# Patient Record
Sex: Female | Born: 1980 | Race: White | Hispanic: No | Marital: Single | Smoking: Never smoker
Health system: Southern US, Community
[De-identification: ages and names within clinical notes are randomized; demographics above are authoritative.]

---

## 2011-09-10 ENCOUNTER — Emergency Department (HOSPITAL_BASED_OUTPATIENT_CLINIC_OR_DEPARTMENT_OTHER)
Admission: EM | Admit: 2011-09-10 | Discharge: 2011-09-10 | Disposition: A | Discharge status: Home / Self Care | Payer: Self-pay | Attending: Emergency Medicine | Admitting: Emergency Medicine

## 2011-09-10 ENCOUNTER — Encounter: Payer: Self-pay | Admitting: *Deleted

## 2011-09-10 DIAGNOSIS — J029 Acute pharyngitis, unspecified: Secondary | ICD-10-CM | POA: Insufficient documentation

## 2011-09-10 DIAGNOSIS — H109 Unspecified conjunctivitis: Secondary | ICD-10-CM | POA: Insufficient documentation

## 2011-09-10 DIAGNOSIS — H5789 Other specified disorders of eye and adnexa: Secondary | ICD-10-CM | POA: Insufficient documentation

## 2011-09-10 DIAGNOSIS — H9209 Otalgia, unspecified ear: Secondary | ICD-10-CM | POA: Insufficient documentation

## 2011-09-10 MED ORDER — GATIFLOXACIN 0.5 % OP SOLN
2.0000 [drp] | OPHTHALMIC | Status: DC
  Filled 2011-09-10: qty 2.5

## 2011-09-10 MED ORDER — CIPROFLOXACIN HCL 0.3 % OP SOLN
OPHTHALMIC | Status: AC
  Filled 2011-09-10: qty 2.5

## 2011-09-10 MED ORDER — CIPROFLOXACIN HCL 0.3 % OP SOLN
2.0000 [drp] | OPHTHALMIC | Status: DC
  Administered 2011-09-10: 2 [drp] via OPHTHALMIC

## 2011-09-10 NOTE — ED Provider Notes (Signed)
History     CSN: [id number]  Arrival date & time 09/10/11  0910   First MD Initiated Contact with Patient 09/10/11 [phone]      Chief Complaint  Patient presents with  . Eye Drainage  . Otalgia  . 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 She states that for the past 2 weeks she has been experiencing nasal congestion and rhinorrhea, pain in her right ear, and her throat. She states that since yesterday her eyes had been watery and itching, painful bilaterally. She complains of discharge in both eyes as well. She woke up this morning and her left eye was matted closed from dry discharge. She states she's been trying not to rub her eyes. She denies headache, dizziness, fever, chills. There no sick contacts. She is not currently have her contacts in place and states that for that reason her vision is blurry. She has no visual contacts are in place.  History reviewed. No pertinent past medical history.  History reviewed. No pertinent past surgical history.  History reviewed. No pertinent family history.  History  Substance Use Topics  . Smoking status: Never Smoker   . Smokeless tobacco: Never Used  . Alcohol Use: Yes     occ    OB History    Grav Para Term Preterm Abortions TAB SAB Ect Mult Living                  Review of Systems  All other systems reviewed and are negative.  except as noted HPI   Allergies  Penicillins  Home Medications  No current outpatient prescriptions on file.  BP 130/90  Pulse 96  Temp(Src) 98.4 F (36.9 C) (Oral)  Resp 20  SpO2 100%  LMP 08/11/2011  Physical Exam  Nursing note and vitals reviewed. Constitutional: She is oriented to person, place, and time. She appears well-developed.  HENT:  Head: Atraumatic.  Nose: Nose normal.  Mouth/Throat: Oropharynx is clear and moist.       TM clear b/l  Eyes: EOM are normal.  Pupils are equal, round, and reactive to light. Right eye exhibits discharge. Left eye exhibits discharge.       Conj erythema/injection b/l Mild periorbital swelling No pain with EOM No photophobia No FB under eyelids    Neck: Normal range of motion. Neck supple.  Cardiovascular: Normal rate, regular rhythm, normal heart sounds and intact distal pulses.   Pulmonary/Chest: Effort normal and breath sounds normal. No respiratory distress. She has no wheezes. She has no rales.  Abdominal: Soft. She exhibits no distension. There is no tenderness. There is no rebound and no guarding.  Musculoskeletal: Normal range of motion.  Neurological: She is alert and oriented to person, place, and time.  Skin: Skin is warm and dry. No rash noted.  Psychiatric: She has a normal mood and affect.    ED Course  Procedures (including critical care time)  Labs Reviewed - No data to display No results found.   1. Conjunctivitis     MDM  Bacterial conjunctivitis. Cipro gtt given in ED as patient does not have insurance. F/U PMD-- given referrals. Precautions for return to the ED.        Forbes Cellar, MD 09/10/11 1012

## 2011-09-10 NOTE — ED Notes (Signed)
Eye redness drainage constant watering today also eyes were matted closed yesterday morning last night pt began having severe right ear pain and and sore throat mostly on the right side denies fever nausea or vomiting

## 2019-07-22 ENCOUNTER — Other Ambulatory Visit: Payer: Self-pay

## 2019-07-22 ENCOUNTER — Emergency Department (HOSPITAL_BASED_OUTPATIENT_CLINIC_OR_DEPARTMENT_OTHER): Payer: Self-pay

## 2019-07-22 ENCOUNTER — Encounter (HOSPITAL_BASED_OUTPATIENT_CLINIC_OR_DEPARTMENT_OTHER): Payer: Self-pay | Admitting: *Deleted

## 2019-07-22 ENCOUNTER — Emergency Department (HOSPITAL_BASED_OUTPATIENT_CLINIC_OR_DEPARTMENT_OTHER)
Admission: EM | Admit: 2019-07-22 | Discharge: 2019-07-22 | Disposition: A | Discharge status: Home / Self Care | Payer: Self-pay | Attending: Emergency Medicine | Admitting: Emergency Medicine

## 2019-07-22 DIAGNOSIS — R1110 Vomiting, unspecified: Secondary | ICD-10-CM | POA: Insufficient documentation

## 2019-07-22 DIAGNOSIS — R1111 Vomiting without nausea: Secondary | ICD-10-CM

## 2019-07-22 DIAGNOSIS — N939 Abnormal uterine and vaginal bleeding, unspecified: Secondary | ICD-10-CM | POA: Insufficient documentation

## 2019-07-22 LAB — WET PREP, GENITAL
Clue Cells Wet Prep HPF POC: NONE SEEN
Sperm: NONE SEEN
Trich, Wet Prep: NONE SEEN
Yeast Wet Prep HPF POC: NONE SEEN

## 2019-07-22 LAB — CBC WITH DIFFERENTIAL/PLATELET
Abs Immature Granulocytes: 0.04 10*3/uL (ref 0.00–0.07)
Basophils Absolute: 0 10*3/uL (ref 0.0–0.1)
Basophils Relative: 0 %
Eosinophils Absolute: 0.1 10*3/uL (ref 0.0–0.5)
Eosinophils Relative: 1 %
HCT: 40.7 % (ref 36.0–46.0)
Hemoglobin: 12.8 g/dL (ref 12.0–15.0)
Immature Granulocytes: 0 %
Lymphocytes Relative: 10 %
Lymphs Abs: 1.1 10*3/uL (ref 0.7–4.0)
MCH: 27.5 pg (ref 26.0–34.0)
MCHC: 31.4 g/dL (ref 30.0–36.0)
MCV: 87.5 fL (ref 80.0–100.0)
Monocytes Absolute: 0.5 10*3/uL (ref 0.1–1.0)
Monocytes Relative: 5 %
Neutro Abs: 9 10*3/uL — ABNORMAL HIGH (ref 1.7–7.7)
Neutrophils Relative %: 84 %
Platelets: 253 10*3/uL (ref 150–400)
RBC: 4.65 MIL/uL (ref 3.87–5.11)
RDW: 13.8 % (ref 11.5–15.5)
WBC: 10.8 10*3/uL — ABNORMAL HIGH (ref 4.0–10.5)
nRBC: 0 % (ref 0.0–0.2)

## 2019-07-22 LAB — URINALYSIS, MICROSCOPIC (REFLEX)

## 2019-07-22 LAB — URINALYSIS, ROUTINE W REFLEX MICROSCOPIC
Bilirubin Urine: NEGATIVE
Glucose, UA: NEGATIVE mg/dL
Ketones, ur: 15 mg/dL — AB
Leukocytes,Ua: NEGATIVE
Nitrite: NEGATIVE
Protein, ur: NEGATIVE mg/dL
Specific Gravity, Urine: >1.03 — ABNORMAL HIGH (ref 1.005–1.030)
pH: 5.5 (ref 5.0–8.0)

## 2019-07-22 LAB — LIPASE, BLOOD: Lipase: 27 U/L (ref 11–51)

## 2019-07-22 LAB — COMPREHENSIVE METABOLIC PANEL
ALT: 15 U/L (ref 0–44)
AST: 16 U/L (ref 15–41)
Albumin: 4.4 g/dL (ref 3.5–5.0)
Alkaline Phosphatase: 46 U/L (ref 38–126)
Anion gap: 8 (ref 5–15)
BUN: 13 mg/dL (ref 6–20)
CO2: 23 mmol/L (ref 22–32)
Calcium: 9.3 mg/dL (ref 8.9–10.3)
Chloride: 108 mmol/L (ref 98–111)
Creatinine, Ser: 0.73 mg/dL (ref 0.44–1.00)
GFR calc Af Amer: >60 mL/min (ref >60)
GFR calc non Af Amer: >60 mL/min (ref >60)
Glucose, Bld: 129 mg/dL — ABNORMAL HIGH (ref 70–99)
Potassium: 3.8 mmol/L (ref 3.5–5.1)
Sodium: 139 mmol/L (ref 135–145)
Total Bilirubin: 0.8 mg/dL (ref 0.3–1.2)
Total Protein: 7.5 g/dL (ref 6.5–8.1)

## 2019-07-22 LAB — PREGNANCY, URINE: Preg Test, Ur: NEGATIVE

## 2019-07-22 MED ORDER — SUCRALFATE 1 G PO TABS: 1.0000 g | ORAL_TABLET | Freq: Three times a day (TID) | ORAL | 0 refills | Status: AC

## 2019-07-22 MED ORDER — ONDANSETRON HCL 4 MG/2ML IJ SOLN
4.0000 mg | Freq: Once | INTRAMUSCULAR | Status: AC
  Administered 2019-07-22: 22:00:00 4 mg via INTRAVENOUS
  Filled 2019-07-22: qty 2

## 2019-07-22 MED ORDER — PANTOPRAZOLE SODIUM 20 MG PO TBEC: 20.0000 mg | DELAYED_RELEASE_TABLET | Freq: Every day | ORAL | 0 refills | Status: AC

## 2019-07-22 MED ORDER — SODIUM CHLORIDE 0.9 % IV BOLUS
1000.0000 mL | Freq: Once | INTRAVENOUS | Status: AC
  Administered 2019-07-22: 21:00:00 1000 mL via INTRAVENOUS

## 2019-07-22 MED ORDER — FENTANYL CITRATE (PF) 100 MCG/2ML IJ SOLN
50.0000 ug | Freq: Once | INTRAMUSCULAR | Status: AC
  Administered 2019-07-22: 21:00:00 50 ug via INTRAVENOUS
  Filled 2019-07-22: qty 2

## 2019-07-22 MED ORDER — IOHEXOL 300 MG/ML  SOLN
100.0000 mL | Freq: Once | INTRAMUSCULAR | Status: AC | PRN
  Administered 2019-07-22: 22:00:00 100 mL via INTRAVENOUS

## 2019-07-22 MED ORDER — PANTOPRAZOLE SODIUM 40 MG IV SOLR
40.0000 mg | Freq: Once | INTRAVENOUS | Status: AC
  Administered 2019-07-22: 21:00:00 40 mg via INTRAVENOUS
  Filled 2019-07-22: qty 40

## 2019-07-22 MED ORDER — ONDANSETRON HCL 4 MG/2ML IJ SOLN
4.0000 mg | Freq: Once | INTRAMUSCULAR | Status: AC
  Administered 2019-07-22: 4 mg via INTRAVENOUS
  Filled 2019-07-22: qty 2

## 2019-07-22 MED ORDER — ONDANSETRON HCL 4 MG PO TABS: 4.0000 mg | ORAL_TABLET | Freq: Three times a day (TID) | ORAL | 0 refills | Status: AC | PRN

## 2019-07-22 NOTE — ED Triage Notes (Signed)
Vomiting. Abnormal menses. She has not had a pregnancy test.

## 2019-07-22 NOTE — ED Provider Notes (Signed)
[facility] EMERGENCY DEPARTMENT Provider Note   CSN: [id number] Arrival date & time: 07/22/19  2003     History   Chief Complaint Chief Complaint  Patient presents with  . Abdominal Pain  . Emesis  . Vaginal Bleeding    HPI Shelia Fletcher is a 38 y.o. female.     The history is provided by the patient.  Abdominal Pain Pain location:  Epigastric Pain quality: aching   Pain radiates to:  Does not radiate Pain severity:  Mild Duration:  1 day Timing:  Constant Progression:  Worsening Chronicity:  New Context: not alcohol use and not previous surgeries   Relieved by:  Nothing Worsened by:  Nothing Associated symptoms: nausea, vaginal bleeding (on cycle but was maybe one week late) and vomiting   Associated symptoms: no chest pain, no chills, no cough, no dysuria, no fever, no hematochezia, no hematuria, no melena, no shortness of breath and no sore throat   Risk factors: NSAID use   Risk factors: no alcohol abuse and has not had multiple surgeries     History reviewed. No pertinent past medical history.  There are no active problems to display for this patient.   History reviewed. No pertinent surgical history.   OB History   No obstetric history on file.      Home Medications    Prior to Admission medications   Medication Sig Start Date End Date Taking? Authorizing Provider  ondansetron (ZOFRAN) 4 MG tablet Take 1 tablet (4 mg total) by mouth every 8 (eight) hours as needed for up to 20 doses for nausea or vomiting. 07/22/19   Dalaney Needle, DO  pantoprazole (PROTONIX) 20 MG tablet Take 1 tablet (20 mg total) by mouth daily. 07/22/19 08/21/19  Juwon Scripter, DO  sucralfate (CARAFATE) 1 g tablet Take 1 tablet (1 g total) by mouth 4 (four) times daily -  with meals and at bedtime for 14 days. 07/22/19 08/05/19  Virgina Norfolk, DO    Family History No family history on file.  Social History Social History   Tobacco Use  . Smoking status:  Never Smoker  . Smokeless tobacco: Never Used  Substance Use Topics  . Alcohol use: Not Currently    Comment: occ  . Drug use: No     Allergies   Penicillins   Review of Systems Review of Systems  Constitutional: Negative for chills and fever.  HENT: Negative for ear pain and sore throat.   Eyes: Negative for pain and visual disturbance.  Respiratory: Negative for cough and shortness of breath.   Cardiovascular: Negative for chest pain and palpitations.  Gastrointestinal: Positive for abdominal pain, nausea and vomiting. Negative for hematochezia and melena.  Genitourinary: Positive for vaginal bleeding (on cycle but was maybe one week late). Negative for dysuria and hematuria.  Musculoskeletal: Negative for arthralgias and back pain.  Skin: Negative for color change and rash.  Neurological: Negative for seizures and syncope.  All other systems reviewed and are negative.    Physical Exam Updated Vital Signs  ED Triage Vitals  Enc Vitals Group     BP 07/22/19 2016 (!) 133/95     Pulse Rate 07/22/19 2016 84     Resp 07/22/19 2016 18     Temp 07/22/19 2016 98 F (36.7 C)     Temp Source 07/22/19 2016 Oral     SpO2 07/22/19 2016 100 %     Weight 07/22/19 2012 194 lb 3.2 oz (88.1 kg)  Height 07/22/19 2012  (1.753 m)     Head Circumference --      Peak Flow --      Pain Score 07/22/19 2012 5     Pain Loc --      Pain Edu? --      Excl. in GC? --     Physical Exam Vitals signs and nursing note reviewed.  Constitutional:      General: She is not in acute distress.    Appearance: She is well-developed. She is not ill-appearing.  HENT:     Head: Normocephalic and atraumatic.     Mouth/Throat:     Mouth: Mucous membranes are moist.  Eyes:     Extraocular Movements: Extraocular movements intact.     Conjunctiva/sclera: Conjunctivae normal.     Pupils: Pupils are equal, round, and reactive to light.  Neck:     Musculoskeletal: Neck supple.  Cardiovascular:      Rate and Rhythm: Normal rate and regular rhythm.     Heart sounds: Normal heart sounds. No murmur.  Pulmonary:     Effort: Pulmonary effort is normal. No respiratory distress.     Breath sounds: Normal breath sounds.  Abdominal:     General: There is no distension.     Palpations: Abdomen is soft.     Tenderness: There is abdominal tenderness in the epigastric area. There is no right CVA tenderness, left CVA tenderness, guarding or rebound. Negative signs include Murphy's sign, Rovsing's sign, McBurney's sign and psoas sign.  Genitourinary:    Vagina: Bleeding present.     Cervix: No cervical motion tenderness, discharge or friability.     Uterus: Normal.      Comments: Scant blood from cervix Skin:    General: Skin is warm and dry.     Capillary Refill: Capillary refill takes less than 2 seconds.  Neurological:     General: No focal deficit present.     Mental Status: She is alert.  Psychiatric:        Mood and Affect: Mood normal.      ED Treatments / Results  Labs (all labs ordered are listed, but only abnormal results are displayed) Labs Reviewed  WET PREP, GENITAL - Abnormal; Notable for the following components:      Result Value   WBC, Wet Prep HPF POC FEW (*)    All other components within normal limits  URINALYSIS, ROUTINE W REFLEX MICROSCOPIC - Abnormal; Notable for the following components:   Specific Gravity, Urine >1.030 (*)    Hgb urine dipstick LARGE (*)    Ketones, ur 15 (*)    All other components within normal limits  CBC WITH DIFFERENTIAL/PLATELET - Abnormal; Notable for the following components:   WBC 10.8 (*)    Neutro Abs 9.0 (*)    All other components within normal limits  COMPREHENSIVE METABOLIC PANEL - Abnormal; Notable for the following components:   Glucose, Bld 129 (*)    All other components within normal limits  URINALYSIS, MICROSCOPIC (REFLEX) - Abnormal; Notable for the following components:   Bacteria, UA FEW (*)    All other  components within normal limits  PREGNANCY, URINE  LIPASE, BLOOD  GC/CHLAMYDIA PROBE AMP ([place]) NOT AT [facility]    EKG None  Radiology Ct Abdomen Pelvis W Contrast  Result Date: 07/22/2019 CLINICAL DATA:  Abdominal distension with epigastric pain and vomiting. EXAM: CT ABDOMEN AND PELVIS WITH CONTRAST TECHNIQUE: Multidetector CT imaging of the abdomen and  pelvis was performed using the standard protocol following bolus administration of intravenous contrast. CONTRAST:  110mL OMNIPAQUE IOHEXOL 300 MG/ML  SOLN COMPARISON:  None. FINDINGS: Lower chest: Normal Hepatobiliary: Few tiny scattered liver cysts. No abnormal gallbladder finding. No sign of ductal dilatation. Pancreas: Normal Spleen: Normal Adrenals/Urinary Tract: Adrenal glands are normal. Kidneys are normal. No cyst, mass, stone or hydronephrosis. Bladder is normal. Stomach/Bowel: No sign of ileus or obstruction. Normal amount of fecal matter. Normal appearing appendix. Vascular/Lymphatic: Normal Reproductive: Normal.  No pelvic mass. Other: No free fluid or air. Musculoskeletal: No significant bone finding. IMPRESSION: Normal CT scan. No abnormality seen to explain the presenting symptoms. Electronically Signed   By: Nelson Chimes M.D.   On: 07/22/2019 21:56    Procedures Procedures (including critical care time)  Medications Ordered in ED Medications  ondansetron (ZOFRAN) injection 4 mg (4 mg Intravenous Given 07/22/19 2104)  fentaNYL (SUBLIMAZE) injection 50 mcg (50 mcg Intravenous Given 07/22/19 2104)  pantoprazole (PROTONIX) injection 40 mg (40 mg Intravenous Given 07/22/19 2103)  sodium chloride 0.9 % bolus 1,000 mL (0 mLs Intravenous Stopped 07/22/19 2154)  iohexol (OMNIPAQUE) 300 MG/ML solution 100 mL (100 mLs Intravenous Contrast Given 07/22/19 2141)     Initial Impression / Assessment and Plan / ED Course  I have reviewed the triage vital signs and the nursing notes.  Pertinent labs & imaging results that were  available during my care of the patient were reviewed by me and considered in my medical decision making (see chart for details).     Shelia Fletcher is a 38 year old female with no significant medical history presents to the ED with abdominal pain.  Patient also states she is having some vaginal bleeding that she does not think is related.  Patient with normal vitals.  No fever.  States that she was about a week late for her menstrual cycle and has had fairly heavy bleeding for the last week.  Bleeding has improved.  Pregnancy test was negative.  Pelvic exam was unremarkable.  Overall likely abnormal uterine bleeding.  However, patient does have significant epigastric abdominal tenderness that started yesterday.  She has had some episodes of nausea and vomiting this morning.  Denies any history of abdominal surgeries.  Does not appear to be tender in the right upper quadrant on exam.  She denies any alcohol use.  However she does heavily use ibuprofen while she is on her menstrual cycle. No melena. Suspect likely gastritis versus pancreatitis versus possibly cholecystitis.  Patient has had normal bowel movements.  Less likely bowel obstruction.  Possibly viral illness or food poisoning.  Will give IV fluids, IV Protonix, IV fentanyl, IV Zofran.  Lab work showed no significant anemia, electrolyte abnormality, kidney injury.  No signs of urinary tract infection.  Lipase is normal doubt pancreatitis.  Gallbladder and liver enzymes are unremarkable.  Doubt cholecystitis.  CT scan of the abdomen and pelvis showed no acute findings.  No appendicitis, no bowel obstruction.  No gallstones.  No pancreatitis.  Patient felt improved following treatments.  Suspect likely gastritis versus viral process.  Will treat with Protonix, Zofran, Carafate.  Recommend that she stop taking NSAIDs.  She does not drink alcohol.  Given return precautions and recommend follow-up with primary care doctor.  Discharged in good condition.   This chart was dictated using voice recognition software.  Despite best efforts to proofread,  errors can occur which can change the documentation meaning.    Final Clinical Impressions(s) / ED Diagnoses  Final diagnoses:  Vomiting without nausea, intractability of vomiting not specified, unspecified vomiting type    ED Discharge Orders         Ordered    ondansetron (ZOFRAN) 4 MG tablet  Every 8 hours PRN     07/22/19 2213    pantoprazole (PROTONIX) 20 MG tablet  Daily     07/22/19 2213    sucralfate (CARAFATE) 1 g tablet  3 times daily with meals & bedtime     07/22/19 2213           Virgina NorfolkCuratolo, Raquel Sayres, DO 07/22/19 2220

## 2019-07-26 LAB — GC/CHLAMYDIA PROBE AMP (~~LOC~~) NOT AT ARMC
Chlamydia: NEGATIVE
Neisseria Gonorrhea: NEGATIVE

## 2020-02-11 IMAGING — CT CT ABD-PELV W/ CM
2 of 4 series · 16 of 46 positions shown, 18 images · IV contrast (omnipaque)
Comparison: None.

CLINICAL DATA: Abdominal distension with epigastric pain and
vomiting.

EXAM:
CT ABDOMEN AND PELVIS WITH CONTRAST
TECHNIQUE: Multidetector CT imaging of the abdomen and pelvis was performed
using the standard protocol following bolus administration of
intravenous contrast.
CONTRAST:  100mL OMNIPAQUE IOHEXOL 300 MG/ML  SOLN

[Series 2: axial st · axial · 0.72mm/px · z∈[-577,-132]mm · 13 of 99 slices shown, 15 images]
[im 5/99  soft-tissue]
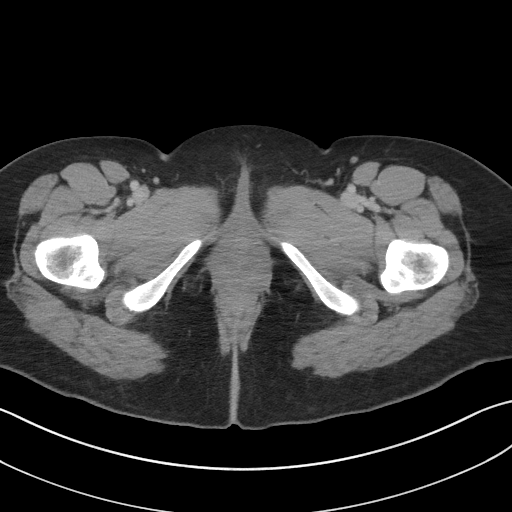
[im 5/99  bone]
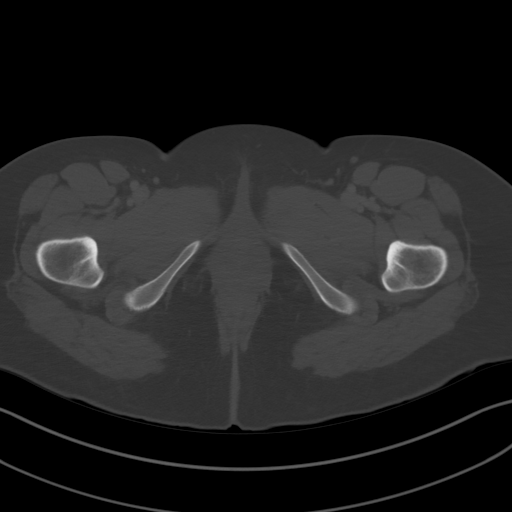
[im 13/99  soft-tissue]
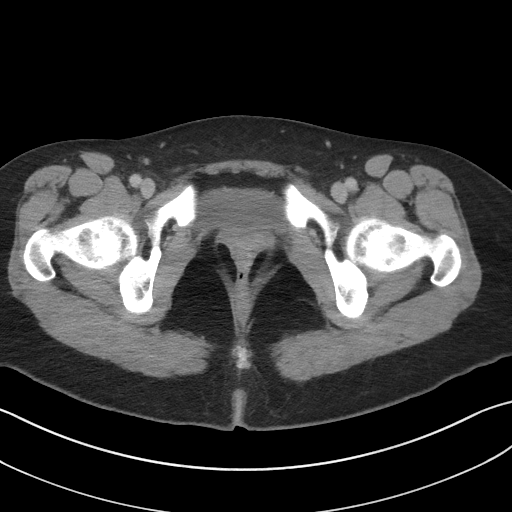
[im 22/99  soft-tissue]
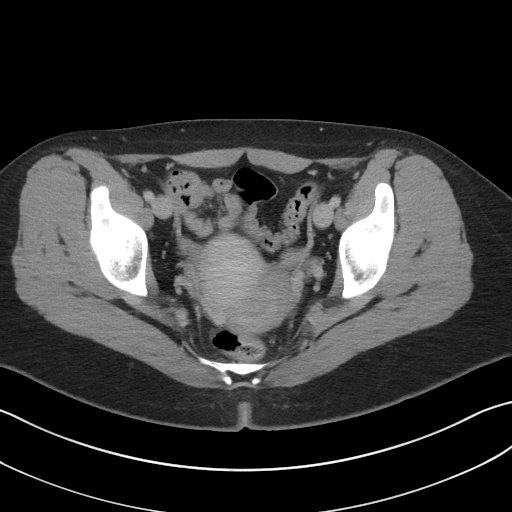
[im 26/99  soft-tissue]
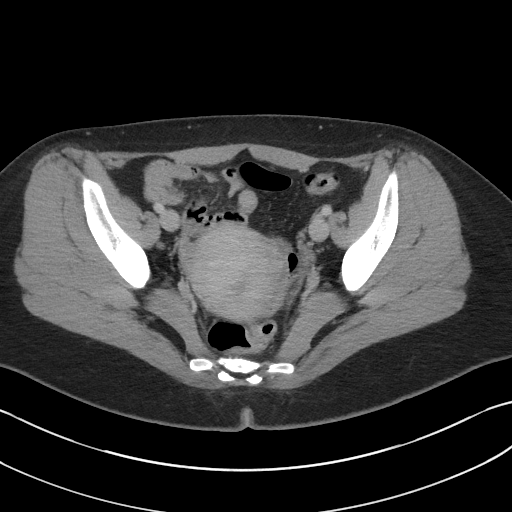
[im 35/99  soft-tissue]
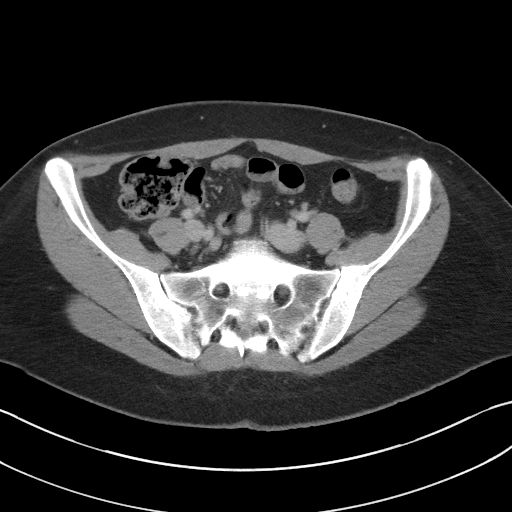
[im 43/99  soft-tissue]
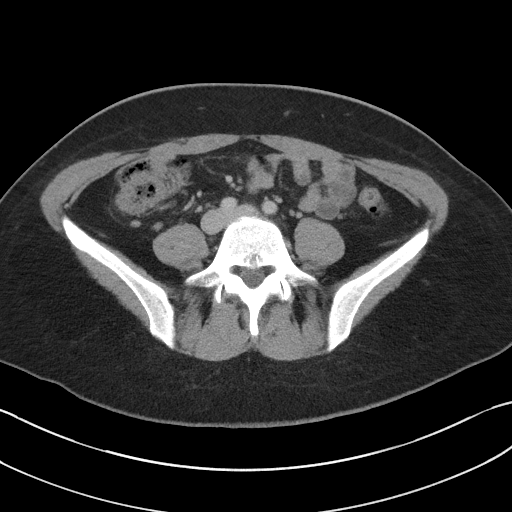
[im 52/99  soft-tissue]
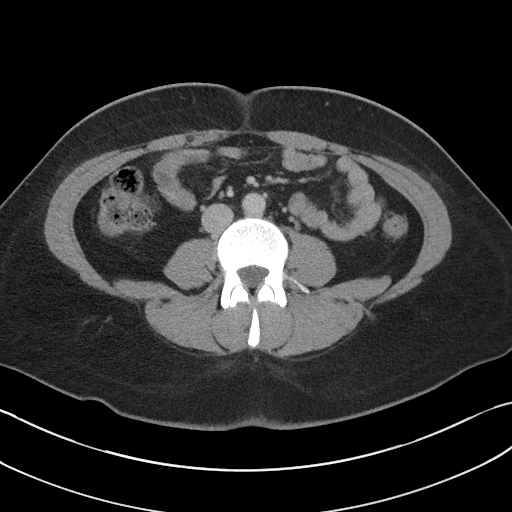
[im 56/99  soft-tissue]
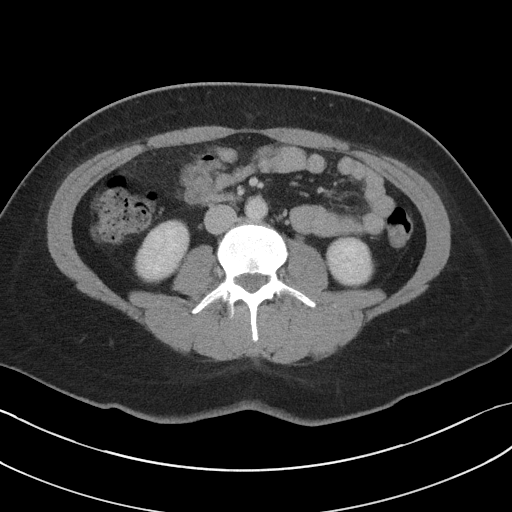
[im 64/99  soft-tissue]
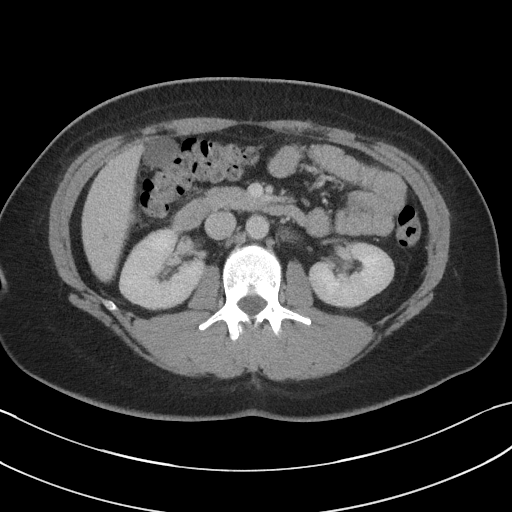
[im 64/99  bone]
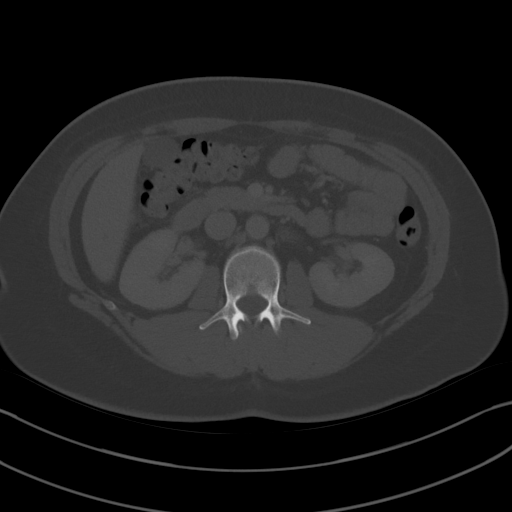
[im 73/99  soft-tissue]
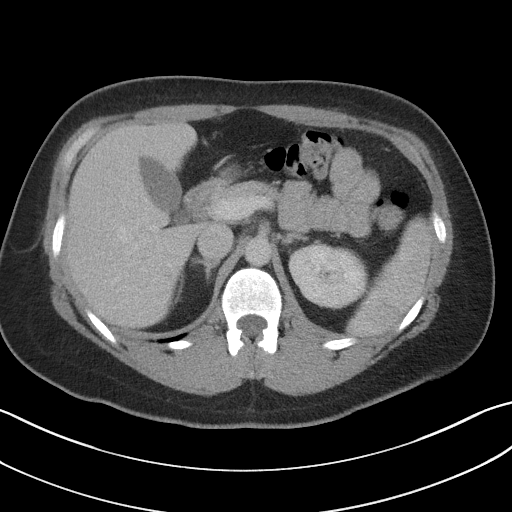
[im 77/99  soft-tissue]
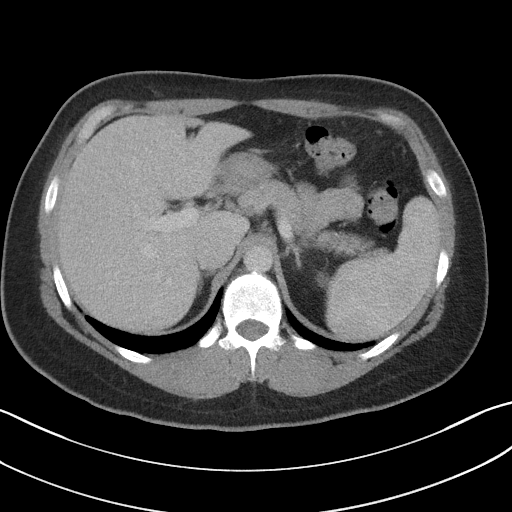
[im 86/99  soft-tissue]
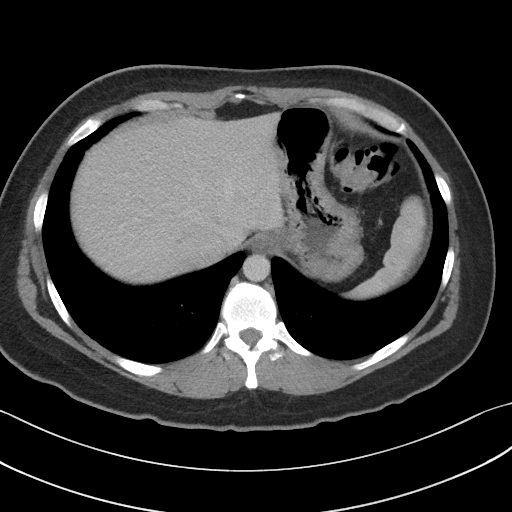
[im 94/99  soft-tissue]
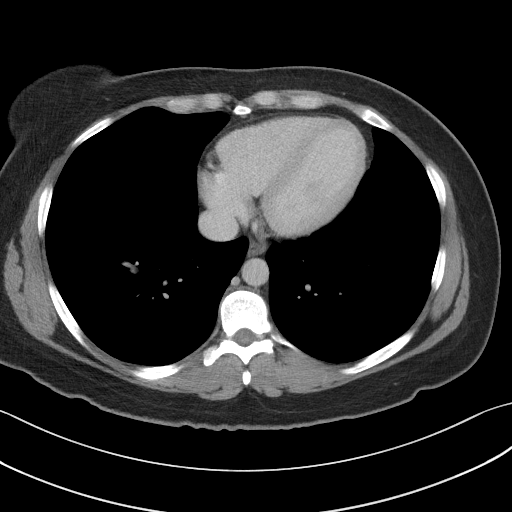

[Series 5: coronal st · coronal · 0.78mm/px · 3 of 101 slices shown]
[im 34/101  soft-tissue]
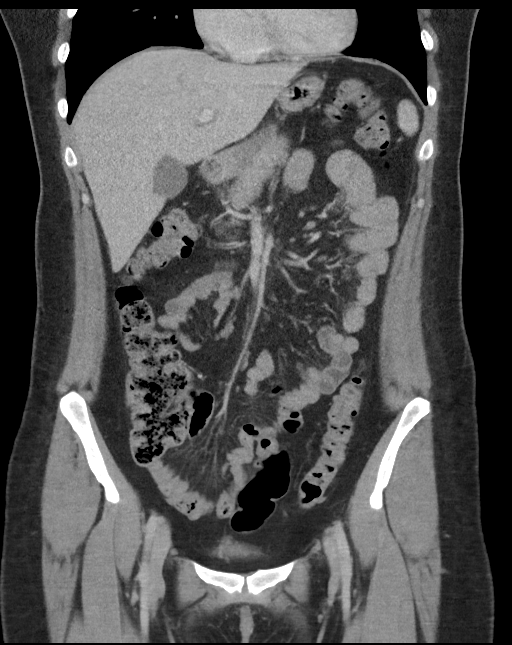
[im 45/101  soft-tissue]
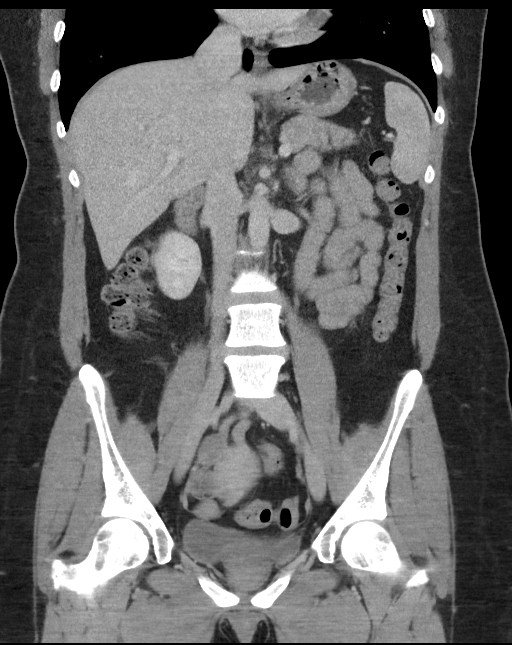
[im 56/101  soft-tissue]
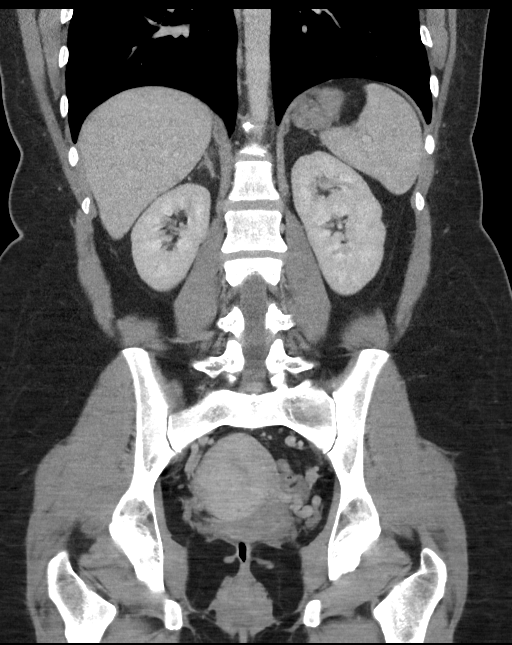

[16 of 46 positions shown; findings below may reference images not displayed]

FINDINGS: Lower chest: Normal

Hepatobiliary: Few tiny scattered liver cysts. No abnormal
gallbladder finding. No sign of ductal dilatation.

Pancreas: Normal

Spleen: Normal

Adrenals/Urinary Tract: Adrenal glands are normal. Kidneys are
normal. No cyst, mass, stone or hydronephrosis. Bladder is normal.

Stomach/Bowel: No sign of ileus or obstruction. Normal amount of
fecal matter. Normal appearing appendix.

Vascular/Lymphatic: Normal

Reproductive: Normal.  No pelvic mass.

Other: No free fluid or air.

Musculoskeletal: No significant bone finding.
IMPRESSION: Normal CT scan. No abnormality seen to explain the presenting
symptoms.
